# Patient Record
Sex: Male | Born: 1970
Health system: Southern US, Community
[De-identification: ages and names within clinical notes are randomized; demographics above are authoritative.]

---

## 2009-05-19 ENCOUNTER — Encounter: Admission: RE | Admit: 2009-05-19 | Discharge: 2009-05-19 | Payer: Self-pay | Admitting: Occupational Medicine

## 2011-12-07 ENCOUNTER — Other Ambulatory Visit: Payer: Self-pay | Admitting: Orthopedic Surgery

## 2011-12-12 NOTE — H&P (Signed)
Subjective: Cameron Ramsey comes in today with an 11 year history of right greater than left carpal tunnel syndrome.  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  He has had EMGs and NCV's in the past to confirm the diagnosis.  A second problem is some paracervical neck pain radiating out over the left shoulder that he says feels like a pinched nerves that he had 2 years ago and did very well with a Medrol Dosepak.  He would like another Medrol Dosepak.   PAST MEDICAL HISTORY:   Allergies:  None.   Medical Problems:  None.  Blood thinners:  None.  Hospitalizations:  None.   Past Surgical History:  None.  Reactions to anesthesia:  None.    Family History:  DM, HTN  Social History: Denies the use of tobacco.  Occasional alcohol.  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    ROS: Patient denies dizziness, nausea, fever, chills, vomiting, shortness of breath, chest pain, loss of appetite, or rash.    PHYSICAL EXAM: Well-developed, well-nourished.  Awake, alert, and oriented x3.  Extraocular motion is intact.  No use of accessory respiratory muscles for breathing.   Cardiovascular exam reveals a regular rhythm.  Skin is intact without cuts, scrapes, or abrasions. Both hands have positive median nerve compression test, positive Phalen's test.  Negative Tinel signs three-view x-rays of both hands show minimal arthritic changes and these were ordered and interpreted by me.  AP and lateral of cervical spine is pretty much unremarkable.  Full range of motion of both upper extremities.  Rotator cuff Powers intact.  Reflexes are 1-2+.  Hoffman sign is negative.  Assess: 1.  Severe right greater than left carpal tunnel syndrome, no thenar atrophy yet. 2.  Left-sided cervical radiculopathy with previous good response to Medrol Dosepak 2 years ago, no muscle weakness today  Plan:  1.  We'll get him set up for right  carpal tunnel release as an outpatient.  Wrist benefits of surgery been discussed questions answered. 2.  Medrol Dosepak for the cervical spine I did offer him physical therapy, which she declined secondary to time constraints.  He'll continue working without restriction at this point.

## 2011-12-18 ENCOUNTER — Encounter (HOSPITAL_BASED_OUTPATIENT_CLINIC_OR_DEPARTMENT_OTHER): Payer: Self-pay | Admitting: *Deleted

## 2011-12-19 ENCOUNTER — Encounter (HOSPITAL_BASED_OUTPATIENT_CLINIC_OR_DEPARTMENT_OTHER)
Admission: RE | Disposition: A | Discharge status: Home / Self Care | Payer: Self-pay | Source: Ambulatory Visit | Attending: Orthopedic Surgery

## 2011-12-19 ENCOUNTER — Encounter (HOSPITAL_BASED_OUTPATIENT_CLINIC_OR_DEPARTMENT_OTHER): Payer: Self-pay | Admitting: Anesthesiology

## 2011-12-19 ENCOUNTER — Ambulatory Visit (HOSPITAL_BASED_OUTPATIENT_CLINIC_OR_DEPARTMENT_OTHER): Payer: 59 | Admitting: Anesthesiology

## 2011-12-19 ENCOUNTER — Encounter (HOSPITAL_BASED_OUTPATIENT_CLINIC_OR_DEPARTMENT_OTHER): Payer: Self-pay | Admitting: *Deleted

## 2011-12-19 ENCOUNTER — Ambulatory Visit (HOSPITAL_BASED_OUTPATIENT_CLINIC_OR_DEPARTMENT_OTHER)
Admission: RE | Admit: 2011-12-19 | Discharge: 2011-12-19 | Disposition: A | Discharge status: Home / Self Care | Payer: 59 | Source: Ambulatory Visit | Attending: Orthopedic Surgery | Admitting: Orthopedic Surgery

## 2011-12-19 DIAGNOSIS — G5600 Carpal tunnel syndrome, unspecified upper limb: Secondary | ICD-10-CM | POA: Insufficient documentation

## 2011-12-19 DIAGNOSIS — G5601 Carpal tunnel syndrome, right upper limb: Secondary | ICD-10-CM

## 2011-12-19 HISTORY — PX: CARPAL TUNNEL RELEASE: SHX101

## 2011-12-19 LAB — POCT HEMOGLOBIN-HEMACUE: Hemoglobin: 13.8 g/dL (ref 13.0–17.0)

## 2011-12-19 SURGERY — CARPAL TUNNEL RELEASE
Anesthesia: General | Site: Hand | Laterality: Right | Wound class: Clean

## 2011-12-19 MED ORDER — BUPIVACAINE HCL (PF) 0.5 % IJ SOLN
INTRAMUSCULAR | Status: DC | PRN
  Administered 2011-12-19: 8 mL

## 2011-12-19 MED ORDER — LACTATED RINGERS IV SOLN
INTRAVENOUS | Status: DC
  Administered 2011-12-19 (×2): via INTRAVENOUS

## 2011-12-19 MED ORDER — PROPOFOL 10 MG/ML IV EMUL
INTRAVENOUS | Status: DC | PRN
  Administered 2011-12-19: 250 mg via INTRAVENOUS

## 2011-12-19 MED ORDER — FENTANYL CITRATE 0.05 MG/ML IJ SOLN
INTRAMUSCULAR | Status: DC | PRN
  Administered 2011-12-19: 50 ug via INTRAVENOUS

## 2011-12-19 MED ORDER — CEFAZOLIN SODIUM-DEXTROSE 2-3 GM-% IV SOLR
2.0000 g | INTRAVENOUS | Status: AC
  Administered 2011-12-19: 2 g via INTRAVENOUS

## 2011-12-19 MED ORDER — CHLORHEXIDINE GLUCONATE 4 % EX LIQD: 60.0000 mL | Freq: Once | CUTANEOUS | Status: DC

## 2011-12-19 MED ORDER — METOCLOPRAMIDE HCL 5 MG/ML IJ SOLN: 10.0000 mg | Freq: Once | INTRAMUSCULAR | Status: DC | PRN

## 2011-12-19 MED ORDER — DEXAMETHASONE SODIUM PHOSPHATE 4 MG/ML IJ SOLN
INTRAMUSCULAR | Status: DC | PRN
  Administered 2011-12-19: 10 mg via INTRAVENOUS

## 2011-12-19 MED ORDER — DEXTROSE-NACL 5-0.45 % IV SOLN: INTRAVENOUS | Status: DC

## 2011-12-19 MED ORDER — HYDROCODONE-ACETAMINOPHEN 5-325 MG PO TABS: 1.0000 | ORAL_TABLET | ORAL | Status: AC | PRN

## 2011-12-19 MED ORDER — ONDANSETRON HCL 4 MG/2ML IJ SOLN
INTRAMUSCULAR | Status: DC | PRN
  Administered 2011-12-19: 4 mg via INTRAVENOUS

## 2011-12-19 MED ORDER — OXYCODONE HCL 5 MG PO TABS
5.0000 mg | ORAL_TABLET | Freq: Once | ORAL | Status: AC | PRN
  Administered 2011-12-19: 5 mg via ORAL

## 2011-12-19 MED ORDER — LIDOCAINE HCL (CARDIAC) 20 MG/ML IV SOLN
INTRAVENOUS | Status: DC | PRN
  Administered 2011-12-19: 100 mg via INTRAVENOUS

## 2011-12-19 MED ORDER — METOCLOPRAMIDE HCL 5 MG/ML IJ SOLN
INTRAMUSCULAR | Status: DC | PRN
  Administered 2011-12-19: 10 mg via INTRAVENOUS

## 2011-12-19 MED ORDER — FENTANYL CITRATE 0.05 MG/ML IJ SOLN
25.0000 ug | INTRAMUSCULAR | Status: DC | PRN
  Administered 2011-12-19: 25 ug via INTRAVENOUS
  Administered 2011-12-19: 50 ug via INTRAVENOUS

## 2011-12-19 MED ORDER — MIDAZOLAM HCL 5 MG/5ML IJ SOLN
INTRAMUSCULAR | Status: DC | PRN
  Administered 2011-12-19: 2 mg via INTRAVENOUS

## 2011-12-19 SURGICAL SUPPLY — 38 items
BLADE SURG 15 STRL LF DISP TIS (BLADE) ×1 IMPLANT
BLADE SURG 15 STRL SS (BLADE) ×2
BNDG CMPR 9X4 STRL LF SNTH (GAUZE/BANDAGES/DRESSINGS) ×1
BNDG CMPR MD 5X2 ELC HKLP STRL (GAUZE/BANDAGES/DRESSINGS) ×1
BNDG ELASTIC 2 VLCR STRL LF (GAUZE/BANDAGES/DRESSINGS) ×2 IMPLANT
BNDG ESMARK 4X9 LF (GAUZE/BANDAGES/DRESSINGS) ×2 IMPLANT
CHLORAPREP W/TINT 26ML (MISCELLANEOUS) ×2 IMPLANT
CLOTH BEACON ORANGE TIMEOUT ST (SAFETY) ×2 IMPLANT
CORDS BIPOLAR (ELECTRODE) ×2 IMPLANT
COVER MAYO STAND STRL (DRAPES) ×2 IMPLANT
COVER TABLE BACK 60X90 (DRAPES) ×2 IMPLANT
CUFF TOURNIQUET SINGLE 18IN (TOURNIQUET CUFF) ×2 IMPLANT
DRAPE EXTREMITY T 121X128X90 (DRAPE) ×2 IMPLANT
DRAPE U-SHAPE 47X51 STRL (DRAPES) IMPLANT
GAUZE SPONGE 4X4 12PLY STRL LF (GAUZE/BANDAGES/DRESSINGS) ×4 IMPLANT
GAUZE XEROFORM 1X8 LF (GAUZE/BANDAGES/DRESSINGS) ×2 IMPLANT
GLOVE BIO SURGEON STRL SZ7 (GLOVE) ×4 IMPLANT
GLOVE BIO SURGEON STRL SZ7.5 (GLOVE) ×2 IMPLANT
GLOVE BIOGEL PI IND STRL 7.0 (GLOVE) ×1 IMPLANT
GLOVE BIOGEL PI IND STRL 8 (GLOVE) ×1 IMPLANT
GLOVE BIOGEL PI INDICATOR 7.0 (GLOVE) ×1
GLOVE BIOGEL PI INDICATOR 8 (GLOVE) ×1
GOWN PREVENTION PLUS XLARGE (GOWN DISPOSABLE) ×3 IMPLANT
GOWN PREVENTION PLUS XXLARGE (GOWN DISPOSABLE) ×2 IMPLANT
NEEDLE HYPO 22GX1.5 SAFETY (NEEDLE) ×1 IMPLANT
NS IRRIG 1000ML POUR BTL (IV SOLUTION) ×2 IMPLANT
PACK BASIN DAY SURGERY FS (CUSTOM PROCEDURE TRAY) ×2 IMPLANT
PADDING CAST ABS 4INX4YD NS (CAST SUPPLIES) ×1
PADDING CAST ABS COTTON 4X4 ST (CAST SUPPLIES) ×1 IMPLANT
PADDING UNDERCAST 2  STERILE (CAST SUPPLIES) ×2 IMPLANT
SLEEVE SCD COMPRESS KNEE MED (MISCELLANEOUS) IMPLANT
SUT ETHILON 4 0 PS 2 18 (SUTURE) ×2 IMPLANT
SUT MON AB 4-0 PC3 18 (SUTURE) IMPLANT
SYR BULB 3OZ (MISCELLANEOUS) ×2 IMPLANT
SYR CONTROL 10ML LL (SYRINGE) ×1 IMPLANT
TOWEL OR 17X24 6PK STRL BLUE (TOWEL DISPOSABLE) ×2 IMPLANT
UNDERPAD 30X30 INCONTINENT (UNDERPADS AND DIAPERS) ×2 IMPLANT
WATER STERILE IRR 1000ML POUR (IV SOLUTION) ×1 IMPLANT

## 2011-12-19 NOTE — Transfer of Care (Signed)
Immediate Anesthesia Transfer of Care Note  Patient: Cameron Ramsey  Procedure(s) Performed: Procedure(s) (LRB): CARPAL TUNNEL RELEASE (Right)  Patient Location: PACU  Anesthesia Type: General  Level of Consciousness: awake  Airway & Oxygen Therapy: Patient Spontanous Breathing and Patient connected to face mask oxygen  Post-op Assessment: Report given to PACU RN and Post -op Vital signs reviewed and stable  Post vital signs: Reviewed and stable  Complications: No apparent anesthesia complications

## 2011-12-19 NOTE — Op Note (Signed)
PATIENT ID:      Cameron Ramsey  MRN:     [id number] DOB/AGE:    06/14/1970 / 41 y.o.       OPERATIVE REPORT    DATE OF PROCEDURE:  12/19/2011       PREOPERATIVE DIAGNOSIS:   right carpal tunnel syndrome      Estimated Body mass index is 27.41 kg/(m^2) as calculated from the following:   Height as of this encounter: 5\' 7" (1.702 m).   Weight as of this encounter: 175 lb(79.379 kg).                                                        POSTOPERATIVE DIAGNOSIS:   right carpal tunnel syndrome                                                                      PROCEDURE:  Procedure(s):  R CARPAL TUNNEL RELEASE     SURGEON: WUJWJ,XBJYN J    ASSISTANT:   Shirl Harris PA-C   (Present and scrubbed throughout the case, critical for assistance with exposure, retraction, and closure.)         ANESTHESIA: GET    TOURNIQUET TIME:   COMPLICATIONS:  None        SPECIMENS: None   INDICATIONS FOR PROCEDURE: The patient has R CTS . Pain wakes patient up at least 5 of 7 nights per week with numbness to the fingertips. Patient is failed conservative measures with observation anti-inflammatory medicine and night splints. Risks and benefits of surgery have been discussed, questions answered.    A tourniquet was placed on the forearm. The upper extremity was prepped and draped in the usual sterile fashion from the tourniquet to the fingertips. A timeout procedure was performed.The hand and forearm were wrapped with an Esmarch bandage and the tourniquet inflated to Hg. A longitudinal palmar incision starting at the wrist flexion crease going distally for 4 cm just to the ulnar-sided the thenar crease was made through the skin and subcutaneous tissue. With traction on the skin edges, we then cut down into the carpal tunnel distally, passed a Therapist, nutritional into the carpal tunnel palmarly. We then cut down on the elevator performing the carpal tunnel release. The fascial incision was extended  into the forearm with the black handled scissors for 2-3cm.We then examined the tendons and the median nerve which had an hourglass appearance but was intact. There were no ganglia or masses in the carpal tunnel. The wound was irrigated out normal saline solution and the skin only closed with running 4-0 nylon suture. A dressing of Xerofoam, 4 x 4 dressing sponges, 2 inch web roll and 2 inch Ace wrap was applied and the Esmarch tourniquet removed for a tourniquet time of 10 minutes. At this point, a sling was applied, the patient was laid supine awakened extubated and taken to the recovery room.

## 2011-12-19 NOTE — Anesthesia Postprocedure Evaluation (Signed)
  Anesthesia Post-op Note  Patient: Cameron Ramsey  Procedure(s) Performed: Procedure(s) (LRB): CARPAL TUNNEL RELEASE (Right)  Patient Location: PACU  Anesthesia Type: General  Level of Consciousness: awake and alert   Airway and Oxygen Therapy: Patient Spontanous Breathing  Post-op Pain: none  Post-op Assessment: Post-op Vital signs reviewed, Patient's Cardiovascular Status Stable, Respiratory Function Stable, Patent Airway and No signs of Nausea or vomiting  Post-op Vital Signs: Reviewed and stable  Complications: No apparent anesthesia complications

## 2011-12-19 NOTE — Anesthesia Preprocedure Evaluation (Signed)
Anesthesia Evaluation  Patient identified by MRN, date of birth, ID band Patient awake    Reviewed: Allergy & Precautions, H&P , NPO status , Patient's Chart, lab work & pertinent test results, reviewed documented beta blocker date and time   Airway Mallampati: II TM Distance: >3 FB Neck ROM: full    Dental   Pulmonary neg pulmonary ROS,          Cardiovascular negative cardio ROS      Neuro/Psych negative neurological ROS  negative psych ROS   GI/Hepatic negative GI ROS, Neg liver ROS,   Endo/Other  negative endocrine ROS  Renal/GU negative Renal ROS  negative genitourinary   Musculoskeletal   Abdominal   Peds  Hematology negative hematology ROS (+)   Anesthesia Other Findings See surgeon's H&P   Reproductive/Obstetrics negative OB ROS                           Anesthesia Physical Anesthesia Plan  ASA: I  Anesthesia Plan: General   Post-op Pain Management:    Induction: Intravenous  Airway Management Planned: LMA  Additional Equipment:   Intra-op Plan:   Post-operative Plan: Extubation in OR  Informed Consent: I have reviewed the patients History and Physical, chart, labs and discussed the procedure including the risks, benefits and alternatives for the proposed anesthesia with the patient or authorized representative who has indicated his/her understanding and acceptance.   Dental Advisory Given  Plan Discussed with: CRNA and Surgeon  Anesthesia Plan Comments:         Anesthesia Quick Evaluation  

## 2011-12-19 NOTE — Interval H&P Note (Signed)
History and Physical Interval Note:  12/19/2011 11:36 AM  Cameron Ramsey  has presented today for surgery, with the diagnosis of right cts  The various methods of treatment have been discussed with the patient and family. After consideration of risks, benefits and other options for treatment, the patient has consented to  Procedure(s) (LRB): CARPAL TUNNEL RELEASE (Right) as a surgical intervention .  The patient's history has been reviewed, patient examined, no change in status, stable for surgery.  I have reviewed the patients' chart and labs.  Questions were answered to the patient's satisfaction.     Nestor Lewandowsky

## 2011-12-21 ENCOUNTER — Encounter (HOSPITAL_BASED_OUTPATIENT_CLINIC_OR_DEPARTMENT_OTHER): Payer: Self-pay | Admitting: Orthopedic Surgery

## 2012-07-07 ENCOUNTER — Telehealth: Payer: Self-pay

## 2012-07-07 ENCOUNTER — Ambulatory Visit (INDEPENDENT_AMBULATORY_CARE_PROVIDER_SITE_OTHER): Payer: 59 | Admitting: Family Medicine

## 2012-07-07 VITALS — BP 127/79 | HR 65 | Temp 98.7°F | Resp 12 | Ht 67.0 in | Wt 183.0 lb

## 2012-07-07 DIAGNOSIS — H9209 Otalgia, unspecified ear: Secondary | ICD-10-CM

## 2012-07-07 DIAGNOSIS — H61899 Other specified disorders of external ear, unspecified ear: Secondary | ICD-10-CM

## 2012-07-07 DIAGNOSIS — B009 Herpesviral infection, unspecified: Secondary | ICD-10-CM

## 2012-07-07 MED ORDER — VALACYCLOVIR HCL 500 MG PO TABS
ORAL_TABLET | ORAL | Status: DC
Start: 2012-07-07 — End: 2012-12-25

## 2012-07-07 NOTE — Patient Instructions (Addendum)
Take Valtrex one twice daily for 5 days. Use the refill at a future time if necessary. If he gets abruptly worse come back in.

## 2012-07-07 NOTE — Progress Notes (Signed)
Subjective: 42 year old male with a history of recurrent herpes infections on his left earlobe. He's been treated multiple times in the past. 2 days ago it started breaking out again on his left ear, and is getting steadily worse.  Objective: Left earlobe is erythematous. There is one fissure on the edge of the lobe.  Assessment: HSV dermatitis on ear  Plan: Valtrex

## 2012-07-07 NOTE — Telephone Encounter (Signed)
PATIENT NEEDS VALTREX REFILLED. [id number] BEST [phone]

## 2012-07-07 NOTE — Telephone Encounter (Signed)
I will pull chart.

## 2012-07-07 NOTE — Telephone Encounter (Signed)
Patient last here in 2010. Valtrex last filled 2011. Patient advised he needed follow up then, patient still needs follow up, 3 yrs late. I called him to advise.

## 2012-12-25 ENCOUNTER — Ambulatory Visit (INDEPENDENT_AMBULATORY_CARE_PROVIDER_SITE_OTHER): Payer: 59 | Admitting: Family Medicine

## 2012-12-25 VITALS — BP 136/80 | HR 62 | Temp 97.8°F | Resp 16 | Ht 68.0 in | Wt 182.0 lb

## 2012-12-25 DIAGNOSIS — B009 Herpesviral infection, unspecified: Secondary | ICD-10-CM

## 2012-12-25 MED ORDER — VALACYCLOVIR HCL 1 G PO TABS: ORAL_TABLET | ORAL | Status: DC

## 2012-12-25 MED ORDER — VALACYCLOVIR HCL 500 MG PO TABS: ORAL_TABLET | ORAL | Status: DC

## 2012-12-25 NOTE — Progress Notes (Signed)
  Subjective:    Patient ID: Cameron Ramsey, male    DOB: 08-18-1970, 42 y.o.   MRN: [id number] Chief Complaint  Patient presents with  . Medication Refill    valtrex   HPI  Has had HSV on left ear his ear for 10 yrs - breakouts flair with stress, anxiety, heat. However, he is now c/o a breakout genitally which he has never had before.  For the past 1 1/2 wks, he had noted a few bumps, not very painful, not itching.  No prob w/ urination or discharge. No new sexual partners.  In a monogamous relationship, has been w/ partner for almost 30 yrs and she does not have any problems, no h/o HSV or HPV that he is aware of.  History reviewed. No pertinent past medical history. Current Outpatient Prescriptions on File Prior to Visit  Medication Sig Dispense Refill  . ibuprofen (ADVIL,MOTRIN) 200 MG tablet Take 200 mg by mouth every 6 (six) hours as needed.       No current facility-administered medications on file prior to visit.   No Known Allergies  Review of Systems  Constitutional: Negative for fever and chills.  HENT: Negative for ear pain.   Genitourinary: Positive for genital sores. Negative for dysuria, urgency, frequency, hematuria, flank pain, decreased urine volume, discharge, penile swelling, scrotal swelling, enuresis, difficulty urinating, penile pain and testicular pain.  Skin: Positive for rash.  Hematological: Negative for adenopathy. Does not bruise/bleed easily.      BP 136/80  Pulse 62  Temp(Src) 97.8 F (36.6 C) (Oral)  Resp 16  Ht 5\' 8"  (1.727 m)  Wt 182 lb (82.555 kg)  BMI 27.68 kg/m2  SpO2 98% Objective:   Physical Exam  Constitutional: He is oriented to person, place, and time. He appears well-developed and well-nourished. No distress.  HENT:  Head: Normocephalic and atraumatic.  Eyes: No scleral icterus.  Pulmonary/Chest: Effort normal.  Neurological: He is alert and oriented to person, place, and time.  Skin: Skin is warm and dry. He is not diaphoretic.   Psychiatric: He has a normal mood and affect. His behavior is normal.      Assessment & Plan:  HSV (herpes simplex virus) infection - Plan: valACYclovir (VALTREX) 500 MG tablet,  valACYclovir (VALTREX) 1000 MG tablet - as this is initial outbreak genitally will do higher dose valtrex at 1000mg  bid x 7d.  However, for refills for future outbreaks, will switch back to lower dose 500mg  bid.  Reminded that this could be folliculitis or genital warts or other so if doesn't improve, RTC for eval at that time.  Meds ordered this encounter  Medications  . valACYclovir (VALTREX) 1000 MG tablet    Sig: Take one pill twice daily for herpes on ear    Dispense:  14 tablet    Refill:  0  . valACYclovir (VALTREX) 500 MG tablet    Sig: Take one pill twice daily for herpes on ear    Dispense:  10 tablet    Refill:  5

## 2013-11-11 ENCOUNTER — Other Ambulatory Visit: Payer: Self-pay | Admitting: Family

## 2013-11-11 ENCOUNTER — Ambulatory Visit
Admission: RE | Admit: 2013-11-11 | Discharge: 2013-11-11 | Disposition: A | Discharge status: Home / Self Care | Payer: 59 | Source: Ambulatory Visit | Attending: Family | Admitting: Family

## 2013-11-11 DIAGNOSIS — IMO0002 Reserved for concepts with insufficient information to code with codable children: Secondary | ICD-10-CM

## 2013-11-11 DIAGNOSIS — T1490XA Injury, unspecified, initial encounter: Secondary | ICD-10-CM

## 2013-11-11 DIAGNOSIS — T148XXA Other injury of unspecified body region, initial encounter: Secondary | ICD-10-CM

## 2014-06-02 ENCOUNTER — Other Ambulatory Visit: Payer: Self-pay | Admitting: Family Medicine

## 2014-12-03 ENCOUNTER — Other Ambulatory Visit: Payer: Self-pay | Admitting: Physician Assistant

## 2015-01-19 ENCOUNTER — Telehealth: Payer: Self-pay

## 2015-01-19 NOTE — Telephone Encounter (Signed)
PT needs a refill on his valtrex.  Please call him at [phone]

## 2015-01-20 ENCOUNTER — Ambulatory Visit (INDEPENDENT_AMBULATORY_CARE_PROVIDER_SITE_OTHER): Payer: 59 | Admitting: Urgent Care

## 2015-01-20 VITALS — BP 126/70 | HR 59 | Temp 98.1°F | Resp 16 | Ht 68.25 in | Wt 176.8 lb

## 2015-01-20 DIAGNOSIS — B009 Herpesviral infection, unspecified: Secondary | ICD-10-CM | POA: Diagnosis not present

## 2015-01-20 MED ORDER — VALACYCLOVIR HCL 500 MG PO TABS: ORAL_TABLET | ORAL | Status: DC

## 2015-01-20 NOTE — Telephone Encounter (Signed)
Pt decided to come in today to be seen to get the refill

## 2015-01-20 NOTE — Patient Instructions (Signed)
Herpes Simplex Herpes simplex is generally classified as Type 1 or Type 2. Type 1 is generally the type that is responsible for cold sores. Type 2 is generally associated with sexually transmitted diseases. We now know that most of the thoughts on these viruses are inaccurate. We find that HSV1 is also present genitally and HSV2 can be present orally, but this will vary in different locations of the world. Herpes simplex is usually detected by doing a culture. Blood tests are also available for this virus; however, the accuracy is often not as good.  PREPARATION FOR TEST No preparation or fasting is necessary. NORMAL FINDINGS  No virus present  No HSV antigens or antibodies present Ranges for normal findings may vary among different laboratories and hospitals. You should always check with your doctor after having lab work or other tests done to discuss the meaning of your test results and whether your values are considered within normal limits. MEANING OF TEST  Your caregiver will go over the test results with you and discuss the importance and meaning of your results, as well as treatment options and the need for additional tests if necessary. OBTAINING THE TEST RESULTS  It is your responsibility to obtain your test results. Ask the lab or department performing the test when and how you will get your results. Document Released: 06/30/2004 Document Revised: 08/20/2011 Document Reviewed: 05/08/2008 ExitCare Patient Information 2015 [employer]. This information is not intended to replace advice given to you by your health care provider. Make sure you discuss any questions you have with your health care provider.  

## 2015-01-20 NOTE — Progress Notes (Signed)
    MRN: [id number] DOB: Jul 06, 1970  Subjective:   Cameron Ramsey is a 44 y.o. male presenting for chief complaint of Medication Refill  Reports one-day history of rash over his left cheek. Reports some burning and stinging sensation over his rash, some numbness and tingling over his temporal scalp on left side. Patient has previously been diagnosed with herpes simplex virus outbreaks over the same area, resolved with Valtrex medication. Denies fever, oral lesions, rash over hands and palms, no other rash elsewhere including genital area. Denies cough, ear pain, ear drainage, rhinorrhea, sore throat. Denies any other aggravating or relieving factors, no other questions or concerns.  Cameron Ramsey has a current medication list which includes the following prescription(s): ibuprofen and valacyclovir. Also has No Known Allergies.  Cameron Ramsey  has no past medical history on file. Also  has past surgical history that includes Carpal tunnel release (12/19/2011).  Objective:   Vitals: BP 126/70 mmHg  Pulse 59  Temp(Src) 98.1 F (36.7 C) (Oral)  Resp 16  Ht 5' 8.25" (1.734 m)  Wt 176 lb 12.8 oz (80.196 kg)  BMI 26.67 kg/m2  SpO2 98%  Physical Exam  Constitutional: He is oriented to person, place, and time. He appears well-developed and well-nourished.  HENT:  Head:    Mouth/Throat: Oropharynx is clear and moist.  Eyes: Conjunctivae are normal. Pupils are equal, round, and reactive to light. No scleral icterus.  Cardiovascular: Normal rate.   Pulmonary/Chest: Effort normal.  Lymphadenopathy:    He has no cervical adenopathy.  Neurological: He is alert and oriented to person, place, and time.  Skin: Skin is warm and dry. Rash noted. No erythema. No pallor.   Assessment and Plan :   1. Herpes - Start Valtrex for recurrence of HSV rash. 6 refills provided, rtc in 4-5 days if no improvement in rash.  Wallis Bamberg, PA-C [facility] [phone] 01/20/2015 9:40 AM

## 2016-04-05 ENCOUNTER — Other Ambulatory Visit: Payer: Self-pay | Admitting: Urgent Care

## 2016-07-24 ENCOUNTER — Other Ambulatory Visit: Payer: Self-pay | Admitting: Emergency Medicine

## 2016-07-24 MED ORDER — VALACYCLOVIR HCL 500 MG PO TABS: 500.0000 mg | ORAL_TABLET | Freq: Two times a day (BID) | ORAL | 0 refills | Status: DC

## 2016-07-24 NOTE — Progress Notes (Signed)
Pt walked in the clinic without scheduled appointment requesting medication RF Valtrex for bad outbreak. Pt was last seen here 1 year ago.  Advised refill will be given this last time with scheduled appointment before leaving clinic.  Med [facility]

## 2016-07-24 NOTE — Progress Notes (Signed)
Called to pharmacy as rx printed

## 2016-07-29 NOTE — Progress Notes (Signed)
   Subjective:    Patient ID: Cameron Ramsey, male    DOB: [birth year], 46 y.o.   MRN: [id number]  Chief Complaint  Patient presents with  . Medication Refill    valtrex    HPI  Cameron Ramsey is a 46 yo who was last seen her 18 mos prior here for a refill on his valtrex.  Patient has previously been diagnosed with herpes simplex virus outbreaks over his left cheek, ear and left temporal scalp around 2004, resolved with Valtrex medication.  Breakouts flair with stress, anxiety, heat. He had a new genital breakout 12/2012 though this was not confirmed.  In a monogamous relationship, has been w/ partner for almost 35 yrs and she does not have any problems, no h/o HSV or HPV that he is aware of.  [facility] 07/30/2016 01/20/2015  Decreased Interest 0 0  Down, Depressed, Hopeless 0 0  PHQ - 2 Score 0 0   History reviewed. No pertinent past medical history. Past Surgical History:  Procedure Laterality Date  . CARPAL TUNNEL RELEASE  12/19/2011   Procedure: CARPAL TUNNEL RELEASE;  Surgeon: Nestor LewandowskyFrank J Rowan, MD;  Location:  SURGERY CENTER;  Service: Orthopedics;  Laterality: Right;   Current Outpatient Prescriptions on File Prior to Visit  Medication Sig Dispense Refill  . ibuprofen (ADVIL,MOTRIN) 200 MG tablet Take 200 mg by mouth every 6 (six) hours as needed.     No current facility-administered medications on file prior to visit.    No Known Allergies History reviewed. No pertinent family history. Social History   Social History  . Marital status: Married    Spouse name: N/A  . Number of children: N/A  . Years of education: N/A   Social History Main Topics  . Smoking status: Never Smoker  . Smokeless tobacco: None  . Alcohol use Yes     Comment: once a week  . Drug use: No  . Sexual activity: Not Asked     Comment: 20 yearas ago in high school   Other Topics Concern  . None   Social History Narrative  . None    Review of Systems See hpi    Objective:     Physical Exam  Constitutional: He is oriented to person, place, and time. He appears well-developed and well-nourished. No distress.  HENT:  Head: Normocephalic and atraumatic.  Eyes: No scleral icterus.  Pulmonary/Chest: Effort normal.  Neurological: He is alert and oriented to person, place, and time.  Skin: Skin is warm and dry. He is not diaphoretic.  Psychiatric: He has a normal mood and affect. His behavior is normal.          Assessment & Plan:  HSV - very rare outbreaks but needs to keep on file in case of occurrence.  Meds ordered this encounter  Medications  . valACYclovir (VALTREX) 500 MG tablet    Sig: Take 1 tablet (500 mg total) by mouth 2 (two) times daily. As instructed by physician    Dispense:  60 tablet    Refill:  0      Norberto SorensonEva Shaw, M.D.  [facility] at [facility] [place]  [place] [address] [place], [place] [id number] [phone] phone [phone] fax  08/26/16 12:30 AM

## 2016-07-30 ENCOUNTER — Ambulatory Visit (INDEPENDENT_AMBULATORY_CARE_PROVIDER_SITE_OTHER): Payer: 59 | Admitting: Family Medicine

## 2016-07-30 ENCOUNTER — Encounter: Payer: Self-pay | Admitting: Family Medicine

## 2016-07-30 VITALS — BP 124/76 | HR 50 | Temp 98.6°F | Resp 16 | Ht 68.0 in | Wt 185.0 lb

## 2016-07-30 DIAGNOSIS — B009 Herpesviral infection, unspecified: Secondary | ICD-10-CM

## 2016-07-30 MED ORDER — VALACYCLOVIR HCL 500 MG PO TABS: 500.0000 mg | ORAL_TABLET | Freq: Two times a day (BID) | ORAL | 0 refills | Status: DC

## 2016-07-30 NOTE — Patient Instructions (Signed)
     IF you received an x-ray today, you will receive an invoice from [facility]. Please contact [facility] at [phone] with questions or concerns regarding your invoice.   IF you received labwork today, you will receive an invoice from [facility]. Please contact [facility] at [phone] with questions or concerns regarding your invoice.   Our billing staff will not be able to assist you with questions regarding bills from these companies.  You will be contacted with the lab results as soon as they are available. The fastest way to get your results is to activate your My Chart account. Instructions are located on the last page of this paperwork. If you have not heard from us regarding the results in 2 weeks, please contact this office.     

## 2016-08-26 ENCOUNTER — Other Ambulatory Visit: Payer: Self-pay | Admitting: Family Medicine

## 2016-11-15 ENCOUNTER — Telehealth: Payer: Self-pay | Admitting: Family Medicine

## 2016-11-15 MED ORDER — VALACYCLOVIR HCL 500 MG PO TABS: 500.0000 mg | ORAL_TABLET | Freq: Two times a day (BID) | ORAL | 5 refills | Status: DC

## 2016-11-15 NOTE — Telephone Encounter (Signed)
PT WALKED INTO OUR OFFICE TODAY FOR AA REFILL ON HIS [facility]

## 2016-11-15 NOTE — Telephone Encounter (Signed)
Refill sent in

## 2018-01-06 ENCOUNTER — Other Ambulatory Visit: Payer: Self-pay | Admitting: Urgent Care

## 2018-02-04 ENCOUNTER — Other Ambulatory Visit: Payer: Self-pay | Admitting: Urgent Care

## 2018-07-24 ENCOUNTER — Other Ambulatory Visit: Payer: Self-pay | Admitting: Urgent Care

## 2018-07-29 NOTE — Telephone Encounter (Signed)
Pt walked into office.  Requested refill of Valtrex, states he needs it today as he is going out of town tomorrow.  Advised patient that he would need to see a provider as he has not been seen in approximately two years.  Next available appt is tomorrow at 2:20 but patient not available.  Patient very upset, cussing and slammed doors open stating he would find another provider.

## 2018-08-20 ENCOUNTER — Other Ambulatory Visit: Payer: Self-pay

## 2018-08-20 ENCOUNTER — Ambulatory Visit: Payer: 59 | Admitting: Family Medicine

## 2018-08-20 ENCOUNTER — Ambulatory Visit (HOSPITAL_COMMUNITY)
Admission: EM | Admit: 2018-08-20 | Discharge: 2018-08-20 | Disposition: A | Discharge status: Home / Self Care | Payer: 59 | Attending: Family Medicine | Admitting: Family Medicine

## 2018-08-20 ENCOUNTER — Encounter (HOSPITAL_COMMUNITY): Payer: Self-pay | Admitting: Emergency Medicine

## 2018-08-20 DIAGNOSIS — Z831 Family history of other infectious and parasitic diseases: Secondary | ICD-10-CM

## 2018-08-20 DIAGNOSIS — Z8619 Personal history of other infectious and parasitic diseases: Secondary | ICD-10-CM

## 2018-08-20 DIAGNOSIS — Z760 Encounter for issue of repeat prescription: Secondary | ICD-10-CM

## 2018-08-20 MED ORDER — ACYCLOVIR 400 MG PO TABS: 400.0000 mg | ORAL_TABLET | Freq: Three times a day (TID) | ORAL | 0 refills | Status: DC

## 2018-08-20 MED ORDER — VALACYCLOVIR HCL 500 MG PO TABS: 500.0000 mg | ORAL_TABLET | Freq: Two times a day (BID) | ORAL | 0 refills | Status: DC

## 2018-08-20 NOTE — Discharge Instructions (Signed)
Valtrex refilled, twice daily Establish care with new PCP for further refills/management

## 2018-08-20 NOTE — ED Provider Notes (Signed)
[facility]    CSN: [id number] Arrival date & time: 08/20/18  0909     History   Chief Complaint Chief Complaint  Patient presents with  . Medication Refill    HPI Cameron Ramsey is a 48 y.o. male history of herpes presenting today for medication refill.  Patient is requesting refill of his Valtrex.  Patient has history of herpes and typically has outbreaks on his ear.  Over the past day he is started develop a tingling sensation on his left ear and is concerned about another outbreak.  He previously was taking this daily, but has been out for the past couple of weeks.  He previously had a PCP, but they left the practice and has been unable to follow-up or establish care with anyone at this point.  He is hoping to establish care with a new primary care.  Denies any rash.  Denies any headaches, vision changes.  Denies any hearing changes or ear pain.  HPI  History reviewed. No pertinent past medical history.  Patient Active Problem List   Diagnosis Date Noted  . Herpes 01/20/2015    Past Surgical History:  Procedure Laterality Date  . CARPAL TUNNEL RELEASE  12/19/2011   Procedure: CARPAL TUNNEL RELEASE;  Surgeon: Nestor Lewandowsky, MD;  Location: [facility];  Service: Orthopedics;  Laterality: Right;       Home Medications    Prior to Admission medications   Medication Sig Start Date End Date Taking? Authorizing Provider  ibuprofen (ADVIL,MOTRIN) 200 MG tablet Take 200 mg by mouth every 6 (six) hours as needed.    [provider]  valACYclovir (VALTREX) 500 MG tablet Take 1 tablet (500 mg total) by mouth 2 (two) times daily. As instructed by physician 08/20/18   Lew Dawes, PA-C    Family History History reviewed. No pertinent family history.  Social History Social History   Tobacco Use  . Smoking status: Never Smoker  Substance Use Topics  . Alcohol use: Yes    Comment: once a week  . Drug use: No     Allergies   Patient  has no known allergies.   Review of Systems Review of Systems  Constitutional: Negative for activity change, appetite change, chills, fatigue and fever.  HENT: Negative for congestion, ear pain, rhinorrhea, sinus pressure, sore throat and trouble swallowing.   Eyes: Negative for discharge and redness.  Respiratory: Negative for cough, chest tightness and shortness of breath.   Cardiovascular: Negative for chest pain.  Gastrointestinal: Negative for abdominal pain, diarrhea, nausea and vomiting.  Musculoskeletal: Negative for myalgias.  Skin: Negative for rash.  Neurological: Negative for dizziness, light-headedness and headaches.     Physical Exam Triage Vital Signs ED Triage Vitals  Enc Vitals Group     BP 08/20/18 0949 133/81     Pulse Rate 08/20/18 0949 67     Resp 08/20/18 0949 18     Temp 08/20/18 0949 (!) 97 F (36.1 C)     Temp Source 08/20/18 0949 Oral     SpO2 08/20/18 0949 98 %     Weight --      Height --      Head Circumference --      Peak Flow --      Pain Score 08/20/18 0947 0     Pain Loc --      Pain Edu? --      Excl. in GC? --    No data found.  Updated Vital Signs BP 133/81 (BP Location: Left Arm)   Pulse 67   Temp (!) 97 F (36.1 C) (Oral)   Resp 18   SpO2 98%   Visual Acuity Right Eye Distance:   Left Eye Distance:   Bilateral Distance:    Right Eye Near:   Left Eye Near:    Bilateral Near:     Physical Exam Vitals signs and nursing note reviewed.  Constitutional:      Appearance: He is well-developed.  HENT:     Head: Normocephalic and atraumatic.     Ears:     Comments: Bilateral ears without tenderness to palpation of external auricle, tragus and mastoid, EAC's without erythema or swelling, TM's with good bony landmarks and cone of light. Non erythematous.     Mouth/Throat:     Comments: Oral mucosa pink and moist, no tonsillar enlargement or exudate. Posterior pharynx patent and nonerythematous, no uvula deviation or  swelling. Normal phonation. Eyes:     Conjunctiva/sclera: Conjunctivae normal.  Neck:     Musculoskeletal: Neck supple.  Cardiovascular:     Rate and Rhythm: Normal rate and regular rhythm.     Heart sounds: No murmur.  Pulmonary:     Effort: Pulmonary effort is normal. No respiratory distress.     Breath sounds: Normal breath sounds.     Comments: Breathing comfortably at rest, CTABL, no wheezing, rales or other adventitious sounds auscultated Abdominal:     Palpations: Abdomen is soft.     Tenderness: There is no abdominal tenderness.  Skin:    General: Skin is warm and dry.     Comments: No rash  Neurological:     Mental Status: He is alert.      UC Treatments / Results  Labs (all labs ordered are listed, but only abnormal results are displayed) Labs Reviewed - No data to display  EKG None  Radiology No results found.  Procedures Procedures (including critical care time)  Medications Ordered in UC Medications - No data to display  Initial Impression / Assessment and Plan / UC Course  I have reviewed the triage vital signs and the nursing notes.  Pertinent labs & imaging results that were available during my care of the patient were reviewed by me and considered in my medical decision making (see chart for details).     Valtrex refilled.  No rash at this time.  Establish care with new PCP for further refills and management.  If symptoms changing or progressing in the something other than typical herpes symptoms to follow-up.Discussed strict return precautions. Patient verbalized understanding and is agreeable with plan.  Final Clinical Impressions(s) / UC Diagnoses   Final diagnoses:  Medication refill  Family history of herpes simplex infection     Discharge Instructions     Valtrex refilled, twice daily Establish care with new PCP for further refills/management   ED Prescriptions    Medication Sig Dispense Auth. Provider   valACYclovir (VALTREX)  500 MG tablet Take 1 tablet (500 mg total) by mouth 2 (two) times daily. As instructed by physician 60 tablet Avory Rahimi, [place] C, PA-C     Controlled Substance Prescriptions [place] Controlled Substance Registry consulted? Not Applicable   Lew Dawes, [place] 08/20/18 1008

## 2018-08-20 NOTE — ED Triage Notes (Addendum)
Patient requesting medication refill-valacyclovir  States usual location of break out is left ear.  "feels" it coming on

## 2018-09-23 ENCOUNTER — Other Ambulatory Visit: Payer: Self-pay

## 2018-09-23 DIAGNOSIS — Z130 Encounter for screening for diseases of the blood and blood-forming organs and certain disorders involving the immune mechanism: Secondary | ICD-10-CM

## 2018-09-23 DIAGNOSIS — Z13220 Encounter for screening for lipoid disorders: Secondary | ICD-10-CM

## 2018-09-23 DIAGNOSIS — Z1329 Encounter for screening for other suspected endocrine disorder: Secondary | ICD-10-CM

## 2018-09-23 DIAGNOSIS — Z13228 Encounter for screening for other metabolic disorders: Secondary | ICD-10-CM

## 2018-09-23 DIAGNOSIS — Z1389 Encounter for screening for other disorder: Secondary | ICD-10-CM

## 2018-09-26 ENCOUNTER — Telehealth: Payer: Self-pay | Admitting: Emergency Medicine

## 2018-09-26 NOTE — Telephone Encounter (Signed)
Called pt LVM for pt to call and convert OV to [facility] appt date 04.22.2020 @ 9:00 FR

## 2018-10-01 ENCOUNTER — Other Ambulatory Visit: Payer: Self-pay

## 2018-10-01 ENCOUNTER — Telehealth (INDEPENDENT_AMBULATORY_CARE_PROVIDER_SITE_OTHER): Payer: 59 | Admitting: Emergency Medicine

## 2018-10-01 DIAGNOSIS — B009 Herpesviral infection, unspecified: Secondary | ICD-10-CM

## 2018-10-01 MED ORDER — VALACYCLOVIR HCL 500 MG PO TABS: 500.0000 mg | ORAL_TABLET | Freq: Two times a day (BID) | ORAL | 3 refills | Status: DC

## 2018-10-01 NOTE — Progress Notes (Signed)
Virtual Visit via Video Note  I connected with Cameron Ramsey on 10/01/18 at  9:00 AM EDT by a video enabled telemedicine application and verified that I am speaking with the correct person using two identifiers.   I discussed the limitations of evaluation and management by telemedicine and the availability of in person appointments. The patient expressed understanding and agreed to proceed.  History of Present Illness: 48 years old male with history of recurrent herpes infections responsive to Valtrex.  Needs medication refill.  First visit with me.  States he gets an average of 3 outbreaks per year.  No other complaints or medical concerns.  No chronic medical problems.  Non-smoker and no EtOH abuser.   Observations/Objective: Awake and oriented x3 in no apparent respiratory distress  Assessment and Plan: Clinically stable.  No medical concerns identified during this visit. Will refill Valtrex. Diagnoses and all orders for this visit:  Recurrent nongenital herpes simplex virus (HSV) infection -     valACYclovir (VALTREX) 500 MG tablet; Take 1 tablet (500 mg total) by mouth 2 (two) times daily. As instructed by physician    Follow Up Instructions: Follow-up in the office for CPE this year at patient's convenience.   I discussed the assessment and treatment plan with the patient. The patient was provided an opportunity to ask questions and all were answered. The patient agreed with the plan and demonstrated an understanding of the instructions.   The patient was advised to call back or seek an in-person evaluation if the symptoms worsen or if the condition fails to improve as anticipated.  I provided of non-face-to-face time during this encounter.   Georgina Quint, [place]

## 2018-10-01 NOTE — Progress Notes (Signed)
Spoke to patient he will establish care and needs medication for herpes. Patient was seen on 08/20/2018 at [facility] and was prescribed Acyclovir. Patient states medication did not help and wants something else. No other complaints. Patient does not smoke.

## 2018-11-06 ENCOUNTER — Telehealth: Payer: Self-pay | Admitting: Emergency Medicine

## 2018-11-06 NOTE — Telephone Encounter (Signed)
Please advise patient 3 refills for this medication were sent in on his last visit. He can call pharmacy to have filled.   His insurance may not cover it due to it being an early refill.

## 2018-11-06 NOTE — Telephone Encounter (Signed)
Called patient see telephone message

## 2018-11-06 NOTE — Telephone Encounter (Signed)
Copied from CRM #[id number]. Topic: Quick Communication - Rx Refill/Question >> Nov 06, 2018 10:26 AM Lynne Logan D wrote: Medication: valACYclovir (VALTREX) 500 MG tablet / Pt stated he went out of town and lost his bottle of medication. He would like to know if another refill can be sent in and also mentioned that there were zero refills for Valtrex. Last OV on 10/01/18  Has the patient contacted their pharmacy? No. (Agent: If no, request that the patient contact the pharmacy for the refill.) (Agent: If yes, when and what did the pharmacy advise?)  Preferred Pharmacy (with phone number or street name): [id number] - [place], [place] - [address] [phone] (Phone) [phone] (Fax)    Agent: Please be advised that RX refills may take up to 3 business days. We ask that you follow-up with your pharmacy.

## 2019-06-01 ENCOUNTER — Other Ambulatory Visit: Payer: 59

## 2019-12-11 ENCOUNTER — Other Ambulatory Visit: Payer: Self-pay | Admitting: Emergency Medicine

## 2019-12-11 DIAGNOSIS — B009 Herpesviral infection, unspecified: Secondary | ICD-10-CM

## 2020-03-04 ENCOUNTER — Other Ambulatory Visit: Payer: Self-pay | Admitting: Emergency Medicine

## 2020-03-04 DIAGNOSIS — B009 Herpesviral infection, unspecified: Secondary | ICD-10-CM

## 2020-03-04 NOTE — Telephone Encounter (Signed)
  Notes to clinic Is this pt associated with your practice? No PCP listed, however, Dr. Terence Lux wrote this rx.

## 2020-07-19 ENCOUNTER — Other Ambulatory Visit: Payer: Self-pay

## 2020-07-19 ENCOUNTER — Telehealth (HOSPITAL_COMMUNITY): Payer: Self-pay | Admitting: Emergency Medicine

## 2020-07-19 ENCOUNTER — Ambulatory Visit (HOSPITAL_COMMUNITY)
Admission: EM | Admit: 2020-07-19 | Discharge: 2020-07-19 | Disposition: A | Discharge status: Home / Self Care | Payer: No Typology Code available for payment source | Attending: Family Medicine | Admitting: Family Medicine

## 2020-07-19 ENCOUNTER — Encounter (HOSPITAL_COMMUNITY): Payer: Self-pay | Admitting: Emergency Medicine

## 2020-07-19 DIAGNOSIS — B009 Herpesviral infection, unspecified: Secondary | ICD-10-CM

## 2020-07-19 DIAGNOSIS — M79604 Pain in right leg: Secondary | ICD-10-CM | POA: Diagnosis not present

## 2020-07-19 MED ORDER — VALACYCLOVIR HCL 500 MG PO TABS: 500.0000 mg | ORAL_TABLET | Freq: Two times a day (BID) | ORAL | 0 refills | Status: DC

## 2020-07-19 MED ORDER — DICLOFENAC SODIUM 75 MG PO TBEC: 75.0000 mg | DELAYED_RELEASE_TABLET | Freq: Two times a day (BID) | ORAL | 0 refills | Status: DC

## 2020-07-19 NOTE — ED Triage Notes (Signed)
Patient has had intermittent groin pain for 2 months.  This episode started 3 days ago and pain is intense as compared to prior episodes of this pain.  Patient has sharp pain in right leg.    Patient reports when having held urine for a long time, it is particularly painful to urinate and flow is weak.  This has been occurring for 6 months.    Patient has empty bottle of acyclovir and requesting refill.  Patient reports he is having an outbreak on left side of face and left ear

## 2020-07-19 NOTE — Telephone Encounter (Signed)
Notified patient that he left eye glasses here.  Patient stated he "may or may not come and get them".  Glasses placed in bag and patient label placed on bag

## 2020-07-19 NOTE — ED Provider Notes (Signed)
[facility]   [id number] 07/19/20 Arrival Time: 1309  ASSESSMENT & PLAN:  1. Leg pain, posterior, right   2. Recurrent nongenital herpes simplex virus (HSV) infection    Question muscular etiology. No sign of hernia. Does not describe as typical nerve pain. Discussed. Able to ambulate here and hemodynamically stable. No indication for imaging of back at this time given no trauma and normal neurological exam. Discussed.  Meds ordered this encounter  Medications  . valACYclovir (VALTREX) 500 MG tablet    Sig: Take 1 tablet (500 mg total) by mouth 2 (two) times daily. As instructed by physician    Dispense:  60 tablet    Refill:  0  . diclofenac (VOLTAREN) 75 MG EC tablet    Sig: Take 1 tablet (75 mg total) by mouth 2 (two) times daily.    Dispense:  14 tablet    Refill:  0   Valtrex refilled at his request. No current outbreak. Medication sedation precautions given. Encourage ROM/movement as tolerated.  Recommend:  Follow-up Information    [facility].   Why: If worsening or failing to improve as anticipated. Contact information: [address] [place] [address] [phone]              Reviewed expectations re: course of current medical issues. Questions answered. Outlined signs and symptoms indicating need for more acute intervention. Patient verbalized understanding. After Visit Summary given.   SUBJECTIVE: History from: patient. Cameron Ramsey is a 50 y.o. male who presents with complaint of intermittent posterior R thigh pain from inferior buttock toward knee. Occasional "groin pain". No injury/trauma. Normal bowel/bladder habits except slight difficulty urinating "after holding it all night" but this is long-standing with no changes. Normal urination during day. No extremity sensation changes or weakness. Worried about hernia; no testicle/scrotal pain or swelling. No penile d/c. Tylenol x 2; thinks  it helped.   OBJECTIVE:  Vitals:   07/19/20 1338  BP: (!) 143/78  Pulse: 62  Resp: 18  Temp: 99 F (37.2 C)  TempSrc: Oral  SpO2: 100%    General appearance: alert; no distress HEENT: [place]; AT Neck: supple with FROM; without midline tenderness CV: regular Lungs: unlabored respirations; speaks full sentences without difficulty Abdomen: soft, non-tender; non-distended Back: no specific TTP; FROM at waist; without midline TTP GU: normal; no scrotal swelling or abnormalities Extremities: without edema; symmetrical without gross deformities; normal ROM of bilateral LE Skin: warm and dry Neurologic: normal gait Psychological: alert and cooperative; normal mood and affect   No Known Allergies  History reviewed. No pertinent past medical history. Social History   Socioeconomic History  . Marital status: Married    Spouse name: Not on file  . Number of children: Not on file  . Years of education: Not on file  . Highest education level: Not on file  Occupational History  . Not on file  Tobacco Use  . Smoking status: Never Smoker  . Smokeless tobacco: Not on file  Substance and Sexual Activity  . Alcohol use: Yes    Comment: once a week  . Drug use: No  . Sexual activity: Not on file    Comment: 20 yearas ago in high school  Other Topics Concern  . Not on file  Social History Narrative  . Not on file   Social Determinants of Health   Financial Resource Strain: Not on file  Food Insecurity: Not on file  Transportation Needs: Not on  file  Physical Activity: Not on file  Stress: Not on file  Social Connections: Not on file  Intimate Partner Violence: Not on file   Family History  Problem Relation Age of Onset  . Hypertension Mother   . Diabetes Father    Past Surgical History:  Procedure Laterality Date  . CARPAL TUNNEL RELEASE  12/19/2011   Procedure: CARPAL TUNNEL RELEASE;  Surgeon: Nestor Lewandowsky, MD;  Location: [facility];  Service:  Orthopedics;  Laterality: Right;     Mardella Layman, MD 07/19/20 1407

## 2021-07-13 ENCOUNTER — Other Ambulatory Visit: Payer: Self-pay

## 2021-07-13 ENCOUNTER — Emergency Department (HOSPITAL_BASED_OUTPATIENT_CLINIC_OR_DEPARTMENT_OTHER)
Admission: EM | Admit: 2021-07-13 | Discharge: 2021-07-13 | Disposition: A | Discharge status: Home / Self Care | Payer: No Typology Code available for payment source | Attending: Emergency Medicine | Admitting: Emergency Medicine

## 2021-07-13 ENCOUNTER — Emergency Department (HOSPITAL_BASED_OUTPATIENT_CLINIC_OR_DEPARTMENT_OTHER): Payer: No Typology Code available for payment source

## 2021-07-13 ENCOUNTER — Encounter (HOSPITAL_BASED_OUTPATIENT_CLINIC_OR_DEPARTMENT_OTHER): Payer: Self-pay | Admitting: Emergency Medicine

## 2021-07-13 DIAGNOSIS — K529 Noninfective gastroenteritis and colitis, unspecified: Secondary | ICD-10-CM | POA: Diagnosis not present

## 2021-07-13 DIAGNOSIS — R1084 Generalized abdominal pain: Secondary | ICD-10-CM | POA: Diagnosis present

## 2021-07-13 LAB — URINALYSIS, MICROSCOPIC (REFLEX)

## 2021-07-13 LAB — COMPREHENSIVE METABOLIC PANEL
ALT: 24 U/L (ref 0–44)
AST: 24 U/L (ref 15–41)
Albumin: 4.9 g/dL (ref 3.5–5.0)
Alkaline Phosphatase: 83 U/L (ref 38–126)
Anion gap: 9 (ref 5–15)
BUN: 23 mg/dL — ABNORMAL HIGH (ref 6–20)
CO2: 26 mmol/L (ref 22–32)
Calcium: 10.5 mg/dL — ABNORMAL HIGH (ref 8.9–10.3)
Chloride: 103 mmol/L (ref 98–111)
Creatinine, Ser: 0.85 mg/dL (ref 0.61–1.24)
GFR, Estimated: >60 mL/min (ref >60)
Glucose, Bld: 176 mg/dL — ABNORMAL HIGH (ref 70–99)
Potassium: 4.3 mmol/L (ref 3.5–5.1)
Sodium: 138 mmol/L (ref 135–145)
Total Bilirubin: 0.7 mg/dL (ref 0.3–1.2)
Total Protein: 7.5 g/dL (ref 6.5–8.1)

## 2021-07-13 LAB — URINALYSIS, ROUTINE W REFLEX MICROSCOPIC
Glucose, UA: NEGATIVE mg/dL
Ketones, ur: 15 mg/dL — AB
Leukocytes,Ua: NEGATIVE
Nitrite: NEGATIVE
Protein, ur: 30 mg/dL — AB
Specific Gravity, Urine: 1.015 (ref 1.005–1.030)
pH: 7.5 (ref 5.0–8.0)

## 2021-07-13 LAB — CBC
HCT: 43.2 % (ref 39.0–52.0)
Hemoglobin: 14.3 g/dL (ref 13.0–17.0)
MCH: 29.2 pg (ref 26.0–34.0)
MCHC: 33.1 g/dL (ref 30.0–36.0)
MCV: 88.3 fL (ref 80.0–100.0)
Platelets: 266 10*3/uL (ref 150–400)
RBC: 4.89 MIL/uL (ref 4.22–5.81)
RDW: 13 % (ref 11.5–15.5)
WBC: 13.3 10*3/uL — ABNORMAL HIGH (ref 4.0–10.5)
nRBC: 0 % (ref 0.0–0.2)

## 2021-07-13 LAB — LIPASE, BLOOD: Lipase: 24 U/L (ref 11–51)

## 2021-07-13 MED ORDER — SODIUM CHLORIDE 0.9 % IV SOLN: INTRAVENOUS | Status: DC | PRN

## 2021-07-13 MED ORDER — LACTATED RINGERS IV BOLUS
1000.0000 mL | Freq: Once | INTRAVENOUS | Status: AC
  Administered 2021-07-13: 1000 mL via INTRAVENOUS

## 2021-07-13 MED ORDER — FENTANYL CITRATE PF 50 MCG/ML IJ SOSY
50.0000 ug | PREFILLED_SYRINGE | Freq: Once | INTRAMUSCULAR | Status: AC
  Administered 2021-07-13: 50 ug via INTRAVENOUS
  Filled 2021-07-13: qty 1

## 2021-07-13 MED ORDER — ONDANSETRON HCL 4 MG/2ML IJ SOLN
4.0000 mg | Freq: Once | INTRAMUSCULAR | Status: AC
  Administered 2021-07-13: 4 mg via INTRAVENOUS
  Filled 2021-07-13: qty 2

## 2021-07-13 MED ORDER — ONDANSETRON 4 MG PO TBDP: 4.0000 mg | ORAL_TABLET | Freq: Three times a day (TID) | ORAL | 0 refills | Status: DC | PRN

## 2021-07-13 MED ORDER — IOHEXOL 300 MG/ML  SOLN
100.0000 mL | Freq: Once | INTRAMUSCULAR | Status: AC | PRN
  Administered 2021-07-13: 100 mL via INTRAVENOUS

## 2021-07-13 MED ORDER — ONDANSETRON HCL 4 MG/2ML IJ SOLN
4.0000 mg | Freq: Once | INTRAMUSCULAR | Status: AC | PRN
  Administered 2021-07-13: 4 mg via INTRAVENOUS
  Filled 2021-07-13: qty 2

## 2021-07-13 NOTE — ED Provider Notes (Signed)
[facility]  Provider Note  CSN: [id number] Arrival date & time: 07/13/21 [id number]  History Chief Complaint  Patient presents with   Abdominal Pain    Cameron Ramsey is a 51 y.o. male with no significant PMH reports onset of mid-abdominal pain, dull aching, last night around 10pm associated with multiple episodes of nausea, vomiting and diarrhea. Symptoms have been persistent through the night. No fever. No blood in emesis or stool. No prior history of similar. No prior surgeries.    Home Medications Prior to Admission medications   Medication Sig Start Date End Date Taking? Authorizing Provider  ondansetron (ZOFRAN-ODT) 4 MG disintegrating tablet Take 1 tablet (4 mg total) by mouth every 8 (eight) hours as needed for nausea or vomiting. 07/13/21  Yes Truddie Hidden, MD     Allergies    Patient has no known allergies.   Review of Systems   Review of Systems Please see HPI for pertinent positives and negatives  Physical Exam BP (!) 149/80 (BP Location: Right Arm)    Pulse (!) 50    Temp 99 F (37.2 C) (Oral)    Resp 16    Ht 5\' 7"  (1.702 m)    Wt 74.8 kg    SpO2 98%    BMI 25.84 kg/m   Physical Exam Vitals and nursing note reviewed.  Constitutional:      Appearance: Normal appearance.  HENT:     Head: Normocephalic and atraumatic.     Nose: Nose normal.     Mouth/Throat:     Mouth: Mucous membranes are moist.  Eyes:     Extraocular Movements: Extraocular movements intact.     Conjunctiva/sclera: Conjunctivae normal.  Cardiovascular:     Rate and Rhythm: Normal rate.  Pulmonary:     Effort: Pulmonary effort is normal.     Breath sounds: Normal breath sounds.  Abdominal:     General: Abdomen is flat.     Palpations: Abdomen is soft.     Tenderness: There is generalized abdominal tenderness. There is guarding. Positive signs include Murphy's sign and McBurney's sign.  Musculoskeletal:        General: No swelling. Normal range of  motion.     Cervical back: Neck supple.  Skin:    General: Skin is warm and dry.  Neurological:     General: No focal deficit present.     Mental Status: He is alert.  Psychiatric:        Mood and Affect: Mood normal.    ED Results / Procedures / Treatments   EKG EKG Interpretation  Date/Time:  Thursday July 13 2021 09:22:59 EST Ventricular Rate:  51 PR Interval:  171 QRS Duration: 85 QT Interval:  444 QTC Calculation: 409 R Axis:   73 Text Interpretation: Sinus rhythm Artifact ST elev, probable normal early repol pattern No old tracing to compare Confirmed by Calvert Cantor [phone]) on 07/13/2021 9:32:17 AM  Procedures Procedures  Medications Ordered in the ED Medications  0.9 %  sodium chloride infusion (0 mLs Intravenous Stopped 07/13/21 1334)  ondansetron (ZOFRAN) injection 4 mg (4 mg Intravenous Given 07/13/21 0941)  fentaNYL (SUBLIMAZE) injection 50 mcg (50 mcg Intravenous Given 07/13/21 1024)  ondansetron (ZOFRAN) injection 4 mg (4 mg Intravenous Given 07/13/21 1104)  lactated ringers bolus 1,000 mL (0 mLs Intravenous Stopped 07/13/21 1216)  iohexol (OMNIPAQUE) 300 MG/ML solution 100 mL (100 mLs Intravenous Contrast Given 07/13/21 1043)  fentaNYL (SUBLIMAZE) injection 50 mcg (50 mcg Intravenous  Given 07/13/21 1119)  lactated ringers bolus 1,000 mL (1,000 mLs Intravenous New Bag/Given 07/13/21 1232)    Initial Impression and Plan  Patient diffusely tender, some guarding on the RUQ and RLQ, will check labs, send for CT, pain/nausea meds for comfort. IVF for vomiting/diarrhea.   ED Course   Clinical Course as of 07/13/21 1350  Thu Jul 13, 2021  1052 CBC with mild leukocytosis, CMP is unremarkable.  [CS]  1120 CT images and results reviewed. No acute process. Awaiting Lipase, lab reports maintenance on their machine is delaying result. Patient given additional pain medications.  [CS]  [id number] Lipase is normal.  [CS]  [id number] UA is neg for infection. He will try PO fluids and if able  to keep them down may be able to go home with Rx for Zofran.  [CS]  1350 Patient tolerating PO, does not require hospitalization. Recommend PCP follow up. RTED for any other concerns [CS]    Clinical Course User Index [CS] Truddie Hidden, MD     MDM Rules/Calculators/A&P Medical Decision Making Problems Addressed: Gastroenteritis: acute illness or injury that poses a threat to life or bodily functions  Amount and/or Complexity of Data Reviewed Labs: ordered. Decision-making details documented in ED Course. Radiology: ordered and independent interpretation performed. Decision-making details documented in ED Course.  Risk Prescription drug management. Decision regarding hospitalization.    Final Clinical Impression(s) / ED Diagnoses Final diagnoses:  Gastroenteritis    Rx / DC Orders ED Discharge Orders          Ordered    ondansetron (ZOFRAN-ODT) 4 MG disintegrating tablet  Every 8 hours PRN        07/13/21 1349             Truddie Hidden, MD 07/13/21 1350

## 2021-07-13 NOTE — ED Triage Notes (Addendum)
Mid abdominal pain since last night.  Pt having N/V/D as well.  No known fever.  Some chills and sweats.  Pt ate hamburger at [employer] 2 hours prior to pain.

## 2021-08-16 ENCOUNTER — Telehealth (HOSPITAL_COMMUNITY): Payer: Self-pay | Admitting: Family Medicine

## 2021-08-16 ENCOUNTER — Ambulatory Visit (HOSPITAL_COMMUNITY)
Admission: EM | Admit: 2021-08-16 | Discharge: 2021-08-16 | Disposition: A | Discharge status: Home / Self Care | Payer: No Typology Code available for payment source

## 2021-08-16 ENCOUNTER — Encounter (HOSPITAL_COMMUNITY): Payer: Self-pay

## 2021-08-16 ENCOUNTER — Other Ambulatory Visit: Payer: Self-pay

## 2021-08-16 DIAGNOSIS — Z760 Encounter for issue of repeat prescription: Secondary | ICD-10-CM

## 2021-08-16 DIAGNOSIS — B009 Herpesviral infection, unspecified: Secondary | ICD-10-CM

## 2021-08-16 MED ORDER — VALACYCLOVIR HCL 500 MG PO TABS: 500.0000 mg | ORAL_TABLET | Freq: Two times a day (BID) | ORAL | 0 refills | Status: DC

## 2021-08-16 MED ORDER — VALACYCLOVIR HCL 500 MG PO TABS: ORAL_TABLET | ORAL | 0 refills | Status: DC

## 2021-08-16 NOTE — Telephone Encounter (Signed)
Requests #60 Valtrex for future outbreaks. ?Sent to pharmacy. ?

## 2021-08-16 NOTE — Discharge Instructions (Addendum)
I have provided you a refill for your Valtrex.  As we discussed, you should follow-up with a primary care provider.  Go to [email or web address] to schedule an appointment online to establish care.  If this is not improving with the Valtrex and it does not seem to be similar to previous outbreaks, I recommend that you are seen by medical provider.  Your blood pressure is high today, make sure that you follow-up with a primary care provider about this.  If you experience chest pain, difficulty breathing, difficulty urinating, new back pain, weakness or numbness on one side of your body or the other, you should be seen in the emergency room right away. ?

## 2021-08-16 NOTE — ED Provider Notes (Signed)
?[facility] ? ? ? ?CSN: [id number] ?Arrival date & time: 08/16/21  1003 ? ? ?  ? ?History   ?Chief Complaint ?Chief Complaint  ?Patient presents with  ? Medication Refill  ? ? ?HPI ?Cameron Ramsey is a 51 y.o. male.  ? ?Medication refill ?History of herpes ?Requests refill of Valtrex ?States that he always gets outbreaks on his left ear ?He is not currently noticed a rash, but is starting to have some tingling ?He presents today requesting Valtrex to keep this of at bay ?He does not currently have a primary care provider ?Otherwise feeling normal in his usual state of health ? ? ?History reviewed. No pertinent past medical history. ? ?Patient Active Problem List  ? Diagnosis Date Noted  ? Herpes 01/20/2015  ? ? ?Past Surgical History:  ?Procedure Laterality Date  ? CARPAL TUNNEL RELEASE  12/19/2011  ? Procedure: CARPAL TUNNEL RELEASE;  Surgeon: Nestor Lewandowsky, MD;  [facility];  Service: Orthopedics;  Laterality: Right;  ? ? ? ? ? ?Home Medications   ? ?Prior to Admission medications   ?Medication Sig Start Date End Date Taking? Authorizing Provider  ?valACYclovir (VALTREX) 500 MG tablet Take 1 tablet (500 mg total) by mouth 2 (two) times daily for 3 days. 08/16/21 08/19/21 Yes Adlai Nieblas, Solmon Ice, DO  ?ondansetron (ZOFRAN-ODT) 4 MG disintegrating tablet Take 1 tablet (4 mg total) by mouth every 8 (eight) hours as needed for nausea or vomiting. 07/13/21   Pollyann Savoy, MD  ? ? ?Family History ?Family History  ?Problem Relation Age of Onset  ? Hypertension Mother   ? Diabetes Father   ? ? ?Social History ?Social History  ? ?Tobacco Use  ? Smoking status: Never  ?Substance Use Topics  ? Alcohol use: Yes  ?  Comment: once a week  ? Drug use: No  ? ? ? ?Allergies   ?Patient has no known allergies. ? ? ?Review of Systems ?Review of Systems  ?All other systems reviewed and are negative. ?Per HPI ? ?Physical Exam ?Triage Vital Signs ?ED Triage Vitals  ?Enc Vitals Group  ?   BP   ?   Pulse    ?   Resp   ?   Temp   ?   Temp src   ?   SpO2   ?   Weight   ?   Height   ?   Head Circumference   ?   Peak Flow   ?   Pain Score   ?   Pain Loc   ?   Pain Edu?   ?   Excl. in [place]?   ? ?No data found. ? ?Updated Vital Signs ?BP (!) 157/71 (BP Location: Right Arm)   Pulse 81   Temp 98.2 ?F (36.8 ?C) (Oral)   Resp 20   SpO2 96%  ? ?Visual Acuity ?Right Eye Distance:   ?Left Eye Distance:   ?Bilateral Distance:   ? ?Right Eye Near:   ?Left Eye Near:    ?Bilateral Near:    ? ?Physical Exam ?Constitutional:   ?   General: He is not in acute distress. ?   Appearance: Normal appearance. He is not ill-appearing.  ?HENT:  ?   Head: Normocephalic and atraumatic.  ?   Ears:  ?   Comments: Normal external ears bilaterally, no rash noted ?Eyes:  ?   Conjunctiva/sclera: Conjunctivae normal.  ?Cardiovascular:  ?   Rate and Rhythm: Normal rate.  ?  Pulmonary:  ?   Effort: Pulmonary effort is normal. No respiratory distress.  ?Musculoskeletal:  ?   Cervical back: Normal range of motion.  ?Skin: ?   General: Skin is warm and dry.  ?   Findings: No rash.  ?Neurological:  ?   Mental Status: He is alert and oriented to person, place, and time.  ?Psychiatric:     ?   Mood and Affect: Mood normal.     ?   Behavior: Behavior normal.  ? ? ? ?UC Treatments / Results  ?Labs ?(all labs ordered are listed, but only abnormal results are displayed) ?Labs Reviewed - No data to display ? ?EKG ? ? ?Radiology ?No results found. ? ?Procedures ?Procedures (including critical care time) ? ?Medications Ordered in UC ?Medications - No data to display ? ?Initial Impression / Assessment and Plan / UC Course  ?I have reviewed the triage vital signs and the nursing notes. ? ?Pertinent labs & imaging results that were available during my care of the patient were reviewed by me and considered in my medical decision making (see chart for details). ? ?  ? ?Prior documented history of herpes with outbreak of year.  Refill provided for Valtrex.  Advise follow-up  if not similar to previous outbreaks.  Recommended follow-up with a primary care provider and advised how to obtain one.  Also recommended follow-up with primary care provider for his blood pressure. ? ? ?Final Clinical Impressions(s) / UC Diagnoses  ? ?Final diagnoses:  ?Medication refill  ?Recurrent nongenital herpes simplex virus (HSV) infection  ? ? ? ?Discharge Instructions   ? ?  ?I have provided you a refill for your Valtrex.  As we discussed, you should follow-up with a primary care provider.  Go to [email or web address] to schedule an appointment online to establish care.  If this is not improving with the Valtrex and it does not seem to be similar to previous outbreaks, I recommend that you are seen by medical provider.  Your blood pressure is high today, make sure that you follow-up with a primary care provider about this.  If you experience chest pain, difficulty breathing, difficulty urinating, new back pain, weakness or numbness on one side of your body or the other, you should be seen in the emergency room right away. ? ? ? ? ?ED Prescriptions   ? ? Medication Sig Dispense Auth. Provider  ? valACYclovir (VALTREX) 500 MG tablet Take 1 tablet (500 mg total) by mouth 2 (two) times daily for 3 days. 6 tablet Haik Mahoney, Solmon Ice, DO  ? ?  ? ?PDMP not reviewed this encounter. ?  ?Unknown Jim, DO ?08/16/21 1037 ? ?

## 2021-08-16 NOTE — ED Triage Notes (Signed)
Pt states he needs a refill on medications (valacyclovir). ?

## 2022-08-09 IMAGING — CT CT ABD-PELV W/ CM
2 of 5 series · 16 of 46 positions shown, 18 images · IV contrast (Omnipaque)
Comparison: None.

CLINICAL DATA: Mid abdominal pain with nausea, vomiting, and
diarrhea.

EXAM:
CT ABDOMEN AND PELVIS WITH CONTRAST
TECHNIQUE: Multidetector CT imaging of the abdomen and pelvis was performed
using the standard protocol following bolus administration of
intravenous contrast.

[Series 2: axial st · axial · 0.79mm/px · z∈[-542,-107]mm · 13 of 99 slices shown, 15 images]
[im 6/99  soft-tissue]
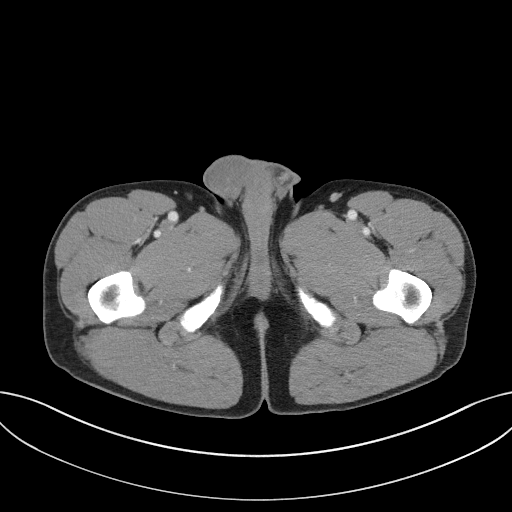
[im 6/99  bone]
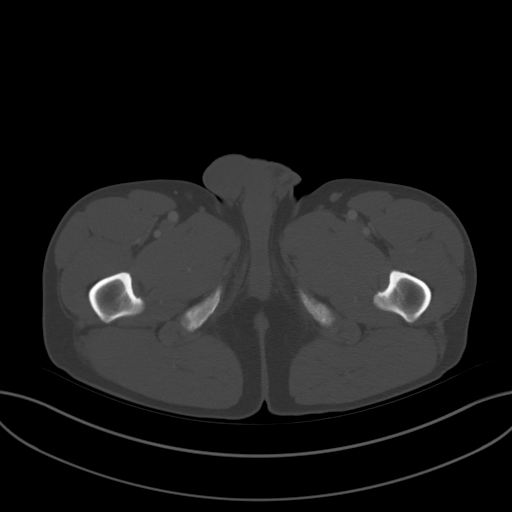
[im 16/99  soft-tissue]
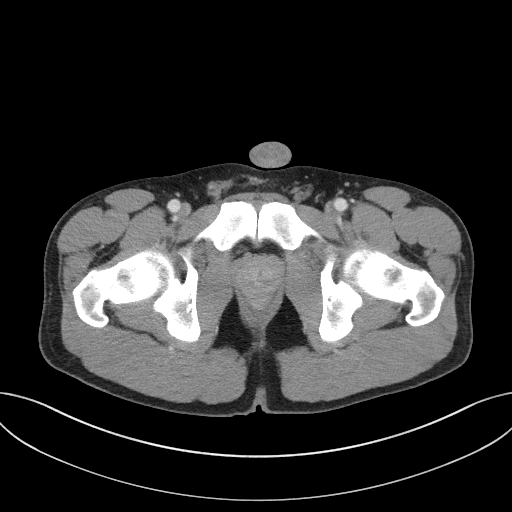
[im 21/99  soft-tissue]
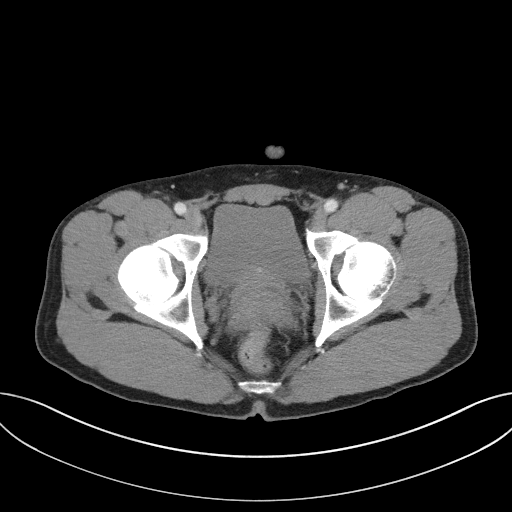
[im 26/99  soft-tissue]
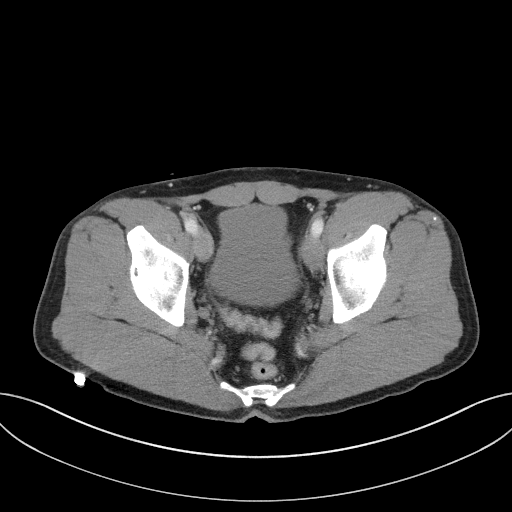
[im 37/99  soft-tissue]
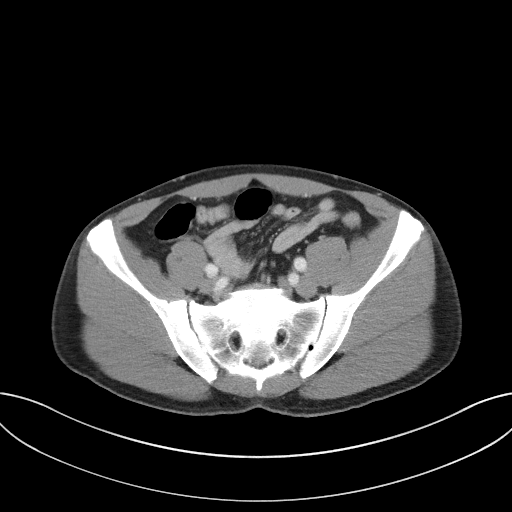
[im 42/99  soft-tissue]
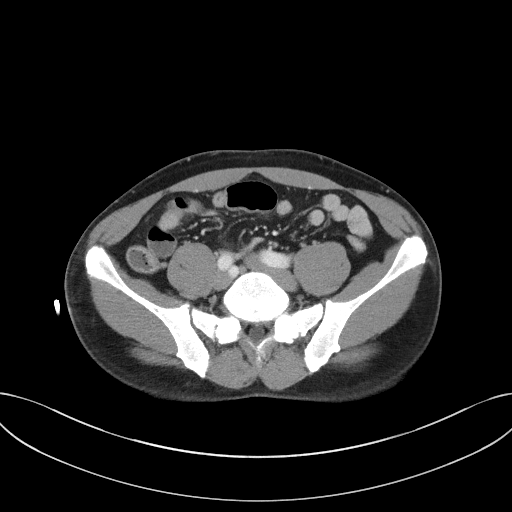
[im 52/99  soft-tissue]
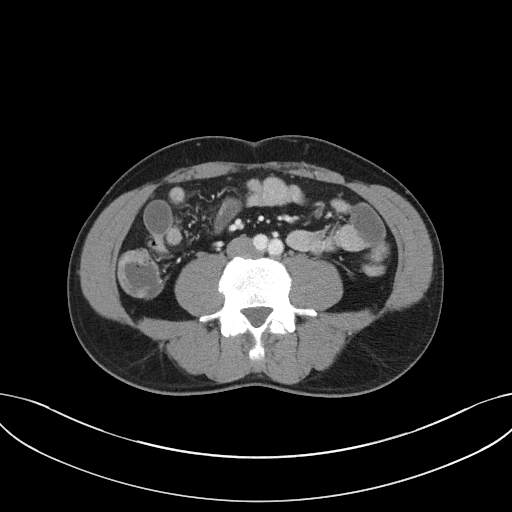
[im 57/99  soft-tissue]
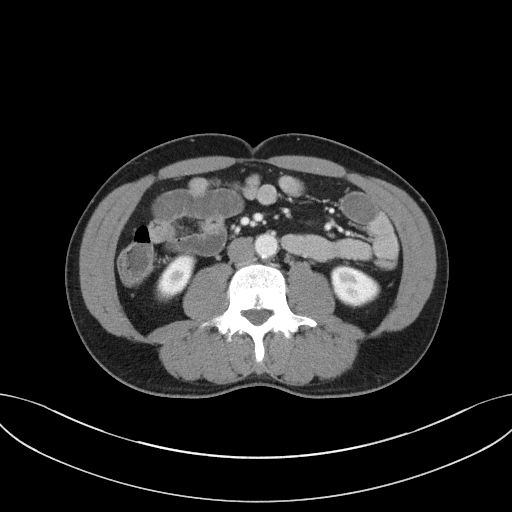
[im 62/99  soft-tissue]
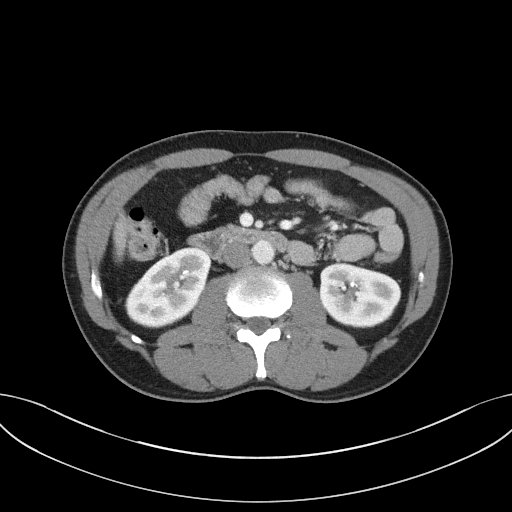
[im 62/99  bone]
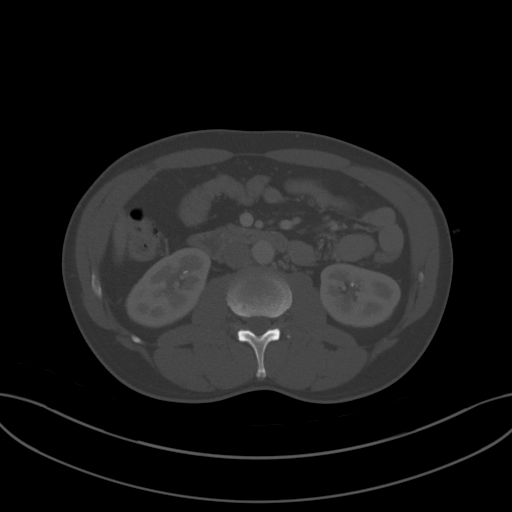
[im 73/99  soft-tissue]
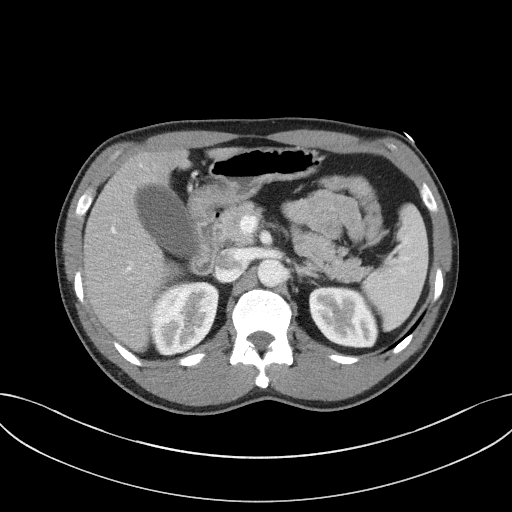
[im 78/99  soft-tissue]
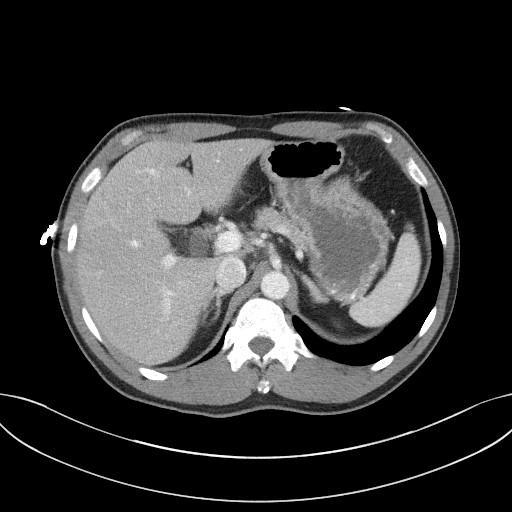
[im 83/99  soft-tissue]
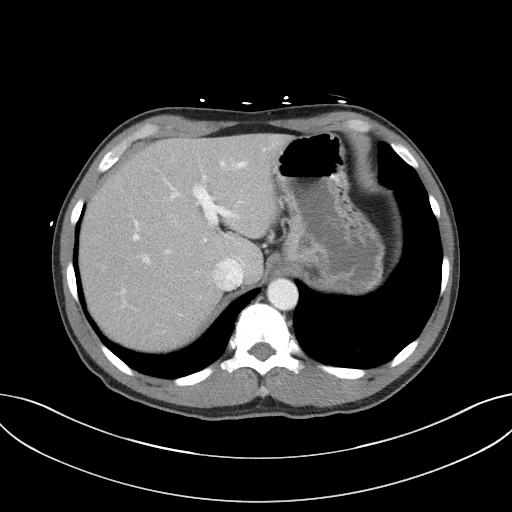
[im 93/99  soft-tissue]
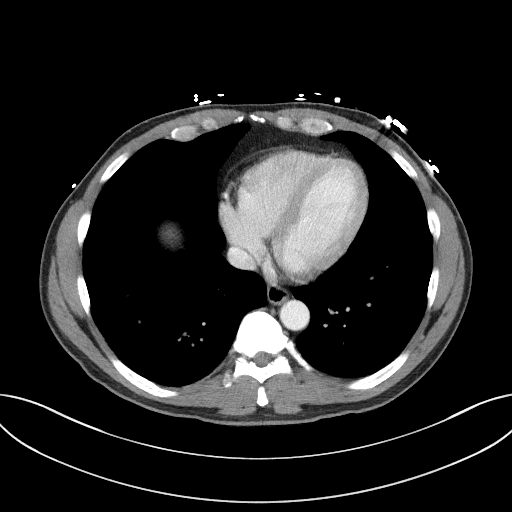

[Series 5: coronal st · coronal · 0.76mm/px · 3 of 85 slices shown]
[im 29/85  soft-tissue]
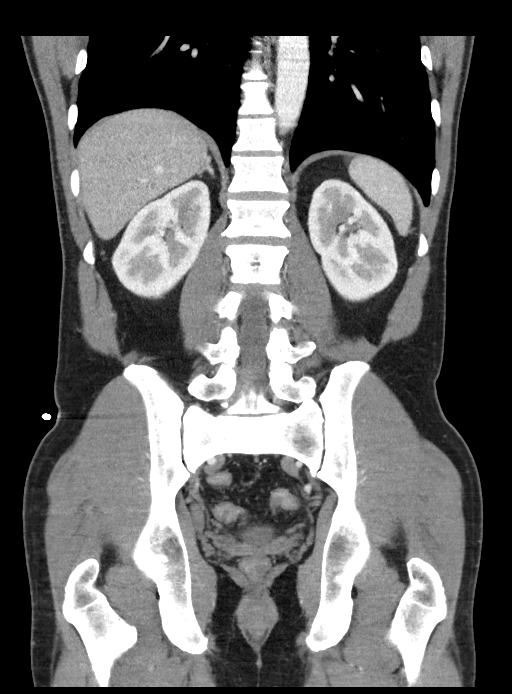
[im 38/85  soft-tissue]
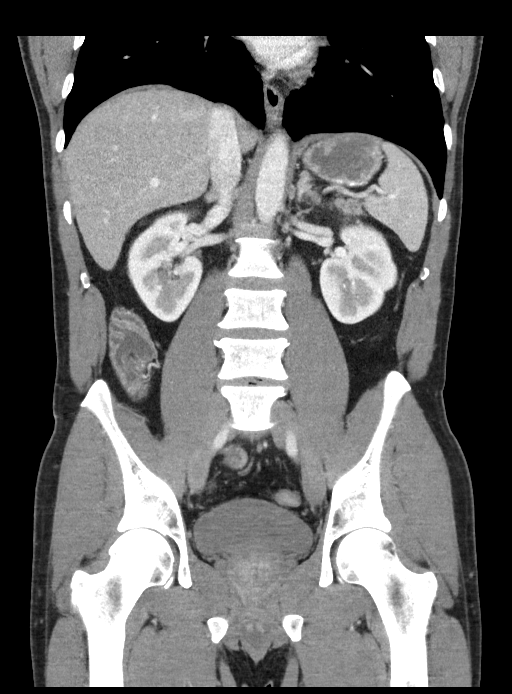
[im 47/85  soft-tissue]
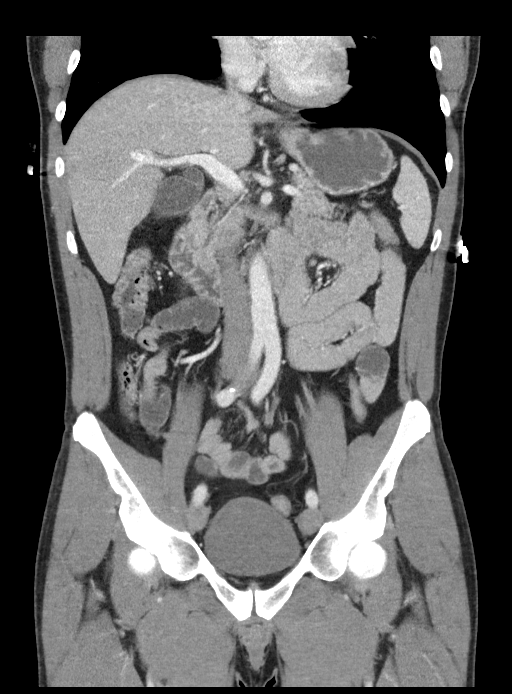

[16 of 46 positions shown; findings below may reference images not displayed]

RADIATION DOSE REDUCTION: This exam was performed according to the
departmental dose-optimization program which includes automated
exposure control, adjustment of the mA and/or kV according to
patient size and/or use of iterative reconstruction technique.

CONTRAST:  100mL OMNIPAQUE IOHEXOL 300 MG/ML  SOLN
FINDINGS: Lower chest: No acute abnormality.

Hepatobiliary: No focal liver abnormality is seen. No gallstones,
gallbladder wall thickening, or biliary dilatation.

Pancreas: Unremarkable. No pancreatic ductal dilatation or
surrounding inflammatory changes.

Spleen: Normal in size without focal abnormality.

Adrenals/Urinary Tract: Adrenal glands are unremarkable. Kidneys are
normal, without renal calculi, focal lesion, or hydronephrosis.
Bladder is unremarkable.

Stomach/Bowel: Stomach is within normal limits. Appendix appears
normal. No evidence of bowel wall thickening, distention, or
inflammatory changes.

Vascular/Lymphatic: Aortic atherosclerosis. No enlarged abdominal or
pelvic lymph nodes.

Reproductive: Prostate is unremarkable.

Other: Tiny fat containing left inguinal hernia. No free fluid or
pneumoperitoneum.

Musculoskeletal: No acute or significant osseous findings.
IMPRESSION: 1. No acute intra-abdominal process.
2. Aortic Atherosclerosis ([id number]).

## 2022-08-16 ENCOUNTER — Encounter (HOSPITAL_COMMUNITY): Payer: Self-pay

## 2022-08-16 ENCOUNTER — Ambulatory Visit (HOSPITAL_COMMUNITY)
Admission: RE | Admit: 2022-08-16 | Discharge: 2022-08-16 | Disposition: A | Discharge status: Home / Self Care | Payer: No Typology Code available for payment source | Source: Ambulatory Visit | Attending: Internal Medicine | Admitting: Internal Medicine

## 2022-08-16 ENCOUNTER — Ambulatory Visit (HOSPITAL_COMMUNITY): Payer: No Typology Code available for payment source

## 2022-08-16 VITALS — BP 151/83 | HR 87 | Temp 98.3°F | Resp 18

## 2022-08-16 DIAGNOSIS — B029 Zoster without complications: Secondary | ICD-10-CM

## 2022-08-16 MED ORDER — VALACYCLOVIR HCL 500 MG PO TABS
ORAL_TABLET | ORAL | 0 refills | Status: AC
Start: 2022-08-16 — End: ?

## 2022-08-16 NOTE — ED Triage Notes (Addendum)
Pt requesting refill on his valtrex. States having an outbreak around his ear x2 days.

## 2022-08-16 NOTE — Discharge Instructions (Signed)
I have sent your medication refill to the pharmacy on file You could pick up the prescription today Please return to urgent care if you have any other concerns.

## 2022-08-17 NOTE — ED Provider Notes (Signed)
[facility]    CSN: [id number] Arrival date & time: 08/16/22  1556      History   Chief Complaint Chief Complaint  Patient presents with   Medication Refill    HPI Cameron Ramsey is a 52 y.o. male with a history of recurrent shingles comes to urgent care for medication refill.  He is experiencing some burning pain on his neck.  This is the usual area affected when there is a shingles flareup.  No fever or chills.Marland Kitchen   HPI  History reviewed. No pertinent past medical history.  Patient Active Problem List   Diagnosis Date Noted   Herpes 01/20/2015    Past Surgical History:  Procedure Laterality Date   CARPAL TUNNEL RELEASE  12/19/2011   Procedure: CARPAL TUNNEL RELEASE;  Surgeon: Kerin Salen, MD;  Location: [facility];  Service: Orthopedics;  Laterality: Right;       Home Medications    Prior to Admission medications   Medication Sig Start Date End Date Taking? Authorizing Provider  valACYclovir (VALTREX) 500 MG tablet Take as directed for recurrent outbreaks. 08/16/22   Damichael Hofman, Myrene Galas, MD    Family History Family History  Problem Relation Age of Onset   Hypertension Mother    Diabetes Father     Social History Social History   Tobacco Use   Smoking status: Never  Substance Use Topics   Alcohol use: Yes    Comment: once a week   Drug use: No     Allergies   Patient has no known allergies.   Review of Systems Review of Systems As per HPI  Physical Exam Triage Vital Signs ED Triage Vitals  Enc Vitals Group     BP 08/16/22 1615 (!) 151/83     Pulse Rate 08/16/22 1615 87     Resp 08/16/22 1615 18     Temp 08/16/22 1615 98.3 F (36.8 C)     Temp Source 08/16/22 1615 Oral     SpO2 08/16/22 1615 96 %     Weight --      Height --      Head Circumference --      Peak Flow --      Pain Score 08/16/22 1616 0     Pain Loc --      Pain Edu? --      Excl. in [place]? --    No data found.  Updated Vital Signs BP (!)  151/83 (BP Location: Right Arm)   Pulse 87   Temp 98.3 F (36.8 C) (Oral)   Resp 18   SpO2 96%   Visual Acuity Right Eye Distance:   Left Eye Distance:   Bilateral Distance:    Right Eye Near:   Left Eye Near:    Bilateral Near:     Physical Exam Musculoskeletal:     Cervical back: Normal range of motion.  Skin:    Comments: Mild erythema over the posterior aspect of the neck below the occipital hairline.  No vesicles noted.      UC Treatments / Results  Labs (all labs ordered are listed, but only abnormal results are displayed) Labs Reviewed - No data to display  EKG   Radiology No results found.  Procedures Procedures (including critical care time)  Medications Ordered in UC Medications - No data to display  Initial Impression / Assessment and Plan / UC Course  I have reviewed the triage vital signs and the nursing notes.  Pertinent labs & imaging results that were available during my care of the patient were reviewed by me and considered in my medical decision making (see chart for details).     1.  Recurrent shingles: Valtrex refilled Tylenol or Motrin as needed for pain and/or fever Return to urgent care if symptoms persist. Final Clinical Impressions(s) / UC Diagnoses   Final diagnoses:  Herpes zoster without complication     Discharge Instructions      I have sent your medication refill to the pharmacy on file You could pick up the prescription today Please return to urgent care if you have any other concerns.   ED Prescriptions     Medication Sig Dispense Auth. Provider   valACYclovir (VALTREX) 500 MG tablet Take as directed for recurrent outbreaks. 60 tablet Makinsley Schiavi, Myrene Galas, MD      PDMP not reviewed this encounter.   Chase Picket, MD 08/17/22 [id number]
